# Patient Record
Sex: Male | Born: 1995 | Race: White | Hispanic: No | Marital: Single | Smoking: Former smoker
Health system: Southern US, Community
[De-identification: ages and names within clinical notes are randomized; demographics above are authoritative.]

---

## 2019-08-10 ENCOUNTER — Ambulatory Visit
Admission: EM | Admit: 2019-08-10 | Discharge: 2019-08-10 | Disposition: A | Discharge status: Home / Self Care | Payer: Self-pay

## 2019-08-10 ENCOUNTER — Other Ambulatory Visit: Payer: Self-pay

## 2019-08-10 DIAGNOSIS — R112 Nausea with vomiting, unspecified: Secondary | ICD-10-CM

## 2019-08-10 DIAGNOSIS — R197 Diarrhea, unspecified: Secondary | ICD-10-CM

## 2019-08-10 NOTE — ED Provider Notes (Signed)
[facility]    CSN: [id number] Arrival date & time: 08/10/19  1003      History   Chief Complaint Chief Complaint  Patient presents with  . Letter for School/Work    HPI Kenniel Trebilcock is a 23 y.o. male.   HPI   23 year old male presents after having the onset of nausea vomiting and diarrhea 3 days prior to this visit.  States it lasted approximately 24 hours and then abated.  At the present time he has no symptoms.  Employer wanted to have him seen and return to work with a clearance note.  He states that he did not eat outside of the home.  This likely was a virus.        History reviewed. No pertinent past medical history.  There are no active problems to display for this patient.   History reviewed. No pertinent surgical history.     Home Medications    Prior to Admission medications   Not on File    Family History History reviewed. No pertinent family history.  Social History Social History   Tobacco Use  . Smoking status: Current Every Day Smoker    Packs/day: 1.00    Types: Cigarettes  Substance Use Topics  . Alcohol use: Yes    Comment: occassionally  . Drug use: Not on file     Allergies   Patient has no known allergies.   Review of Systems Review of Systems  Constitutional: Negative for activity change, appetite change, chills, diaphoresis, fatigue and fever.  Gastrointestinal: Positive for diarrhea, nausea and vomiting. Negative for abdominal pain and constipation.  All other systems reviewed and are negative.    Physical Exam Triage Vital Signs ED Triage Vitals [08/10/19 1021]  Enc Vitals Group     BP (!) 147/80     Pulse Rate 94     Resp 18     Temp 98.3 F (36.8 C)     Temp Source Oral     SpO2 100 %     Weight 180 lb (81.6 kg)     Height 6' (1.829 m)     Head Circumference      Peak Flow      Pain Score 0     Pain Loc      Pain Edu?      Excl. in [place]?    No data found.  Updated Vital Signs BP (!)  147/80 (BP Location: Left Arm)   Pulse 94   Temp 98.3 F (36.8 C) (Oral)   Resp 18   Ht 6' (1.829 m)   Wt 180 lb (81.6 kg)   SpO2 100%   BMI 24.41 kg/m   Visual Acuity Right Eye Distance:   Left Eye Distance:   Bilateral Distance:    Right Eye Near:   Left Eye Near:    Bilateral Near:     Physical Exam Vitals signs and nursing note reviewed.  Constitutional:      General: He is not in acute distress.    Appearance: Normal appearance. He is normal weight. He is not ill-appearing, toxic-appearing or diaphoretic.  HENT:     Head: Normocephalic and atraumatic.  Eyes:     Conjunctiva/sclera: Conjunctivae normal.  Neck:     Musculoskeletal: Normal range of motion and neck supple.  Abdominal:     General: Abdomen is flat. Bowel sounds are normal. There is no distension.     Palpations: Abdomen is soft. There is no mass.  Tenderness: There is no abdominal tenderness. There is no right CVA tenderness, left CVA tenderness, guarding or rebound.  Musculoskeletal: Normal range of motion.  Skin:    General: Skin is warm and dry.  Neurological:     General: No focal deficit present.     Mental Status: He is alert and oriented to person, place, and time.  Psychiatric:        Mood and Affect: Mood normal.        Behavior: Behavior normal.        Thought Content: Thought content normal.        Judgment: Judgment normal.      UC Treatments / Results  Labs (all labs ordered are listed, but only abnormal results are displayed) Labs Reviewed - No data to display  EKG   Radiology No results found.  Procedures Procedures (including critical care time)  Medications Ordered in UC Medications - No data to display  Initial Impression / Assessment and Plan / UC Course  I have reviewed the triage vital signs and the nursing notes.  Pertinent labs & imaging results that were available during my care of the patient were reviewed by me and considered in my medical decision  making (see chart for details).   This 23 year old male complained of having an episode of nausea vomiting and diarrhea that persisted for approximately 24 hours.  He states that it abated quickly and is not had the return of any symptoms.  He is in his usual normal state of health.  His employer requires a note for return to work.  Was provided to the patient.  Will return for second shift today.   Final Clinical Impressions(s) / UC Diagnoses   Final diagnoses:  Nausea vomiting and diarrhea     Discharge Instructions     Patient has improved from a bout of nausea vomiting and diarrhea days prior to this visit.  Now asymptomatic.  He may now return to work.    ED Prescriptions    None     PDMP not reviewed this encounter.   Lutricia Feil, PA-C 08/10/19 1139

## 2019-08-10 NOTE — Discharge Instructions (Signed)
Patient has improved from a bout of nausea vomiting and diarrhea days prior to this visit.  Now asymptomatic.  He may now return to work.

## 2019-08-10 NOTE — ED Triage Notes (Signed)
Pt reports that his boss asked him to come here "and get checked out and get a note for work". Pt reports NVD on Friday only that went away by that evening. Pt denies NVD, abdominal pain or any other sx currently.

## 2019-12-11 ENCOUNTER — Ambulatory Visit
Admission: EM | Admit: 2019-12-11 | Discharge: 2019-12-11 | Disposition: A | Discharge status: Home / Self Care | Payer: Self-pay | Attending: Family Medicine | Admitting: Family Medicine

## 2019-12-11 ENCOUNTER — Ambulatory Visit (INDEPENDENT_AMBULATORY_CARE_PROVIDER_SITE_OTHER): Payer: Self-pay

## 2019-12-11 ENCOUNTER — Encounter: Payer: Self-pay | Admitting: Emergency Medicine

## 2019-12-11 ENCOUNTER — Other Ambulatory Visit: Payer: Self-pay

## 2019-12-11 DIAGNOSIS — M545 Low back pain: Secondary | ICD-10-CM

## 2019-12-11 DIAGNOSIS — M542 Cervicalgia: Secondary | ICD-10-CM

## 2019-12-11 DIAGNOSIS — W19XXXA Unspecified fall, initial encounter: Secondary | ICD-10-CM

## 2019-12-11 DIAGNOSIS — S161XXA Strain of muscle, fascia and tendon at neck level, initial encounter: Secondary | ICD-10-CM

## 2019-12-11 DIAGNOSIS — M546 Pain in thoracic spine: Secondary | ICD-10-CM

## 2019-12-11 DIAGNOSIS — S29019A Strain of muscle and tendon of unspecified wall of thorax, initial encounter: Secondary | ICD-10-CM

## 2019-12-11 DIAGNOSIS — S39012A Strain of muscle, fascia and tendon of lower back, initial encounter: Secondary | ICD-10-CM

## 2019-12-11 DIAGNOSIS — S20229A Contusion of unspecified back wall of thorax, initial encounter: Secondary | ICD-10-CM

## 2019-12-11 MED ORDER — CYCLOBENZAPRINE HCL 10 MG PO TABS: 10.0000 mg | ORAL_TABLET | Freq: Every day | ORAL | 0 refills | Status: DC

## 2019-12-11 NOTE — ED Provider Notes (Signed)
[facility]    CSN: [id number] Arrival date & time: 12/11/19  1625      History   Chief Complaint Chief Complaint  Patient presents with  . Back Pain    HPI Isaac Velazquez is a 24 y.o. male.   24 yo male with a c/o neck and back pain after falling yesterday. States he slipped and fell straight on his back yesterday. Denies hitting his head or loss of consciousness. Denies any numbness/tingling, unilateral weakness.      History reviewed. No pertinent past medical history.  There are no problems to display for this patient.   History reviewed. No pertinent surgical history.     Home Medications    Prior to Admission medications   Medication Sig Start Date End Date Taking? Authorizing Provider  cyclobenzaprine (FLEXERIL) 10 MG tablet Take 1 tablet (10 mg total) by mouth at bedtime. 12/11/19   Norval Gable, MD    Family History Family History  Problem Relation Age of Onset  . Healthy Mother   . Healthy Father     Social History Social History   Tobacco Use  . Smoking status: Current Every Day Smoker    Packs/day: 1.00    Types: E-cigarettes  . Smokeless tobacco: Never Used  Substance Use Topics  . Alcohol use: Yes    Comment: occassionally  . Drug use: Not on file     Allergies   Patient has no known allergies.   Review of Systems Review of Systems   Physical Exam Triage Vital Signs ED Triage Vitals  Enc Vitals Group     BP 12/11/19 1641 119/67     Pulse Rate 12/11/19 1641 66     Resp 12/11/19 1641 16     Temp 12/11/19 1641 98 F (36.7 C)     Temp Source 12/11/19 1641 Oral     SpO2 12/11/19 1641 100 %     Weight 12/11/19 1638 180 lb (81.6 kg)     Height 12/11/19 1638 6\' 1"  (1.854 m)     Head Circumference --      Peak Flow --      Pain Score 12/11/19 1638 8     Pain Loc --      Pain Edu? --      Excl. in [place]? --    No data found.  Updated Vital Signs BP 119/67 (BP Location: Right Arm)   Pulse 66   Temp 98 F (36.7  C) (Oral)   Resp 16   Ht 6\' 1"  (1.854 m)   Wt 81.6 kg   SpO2 100%   BMI 23.75 kg/m   Visual Acuity Right Eye Distance:   Left Eye Distance:   Bilateral Distance:    Right Eye Near:   Left Eye Near:    Bilateral Near:     Physical Exam Vitals and nursing note reviewed.  Constitutional:      General: He is not in acute distress.    Appearance: He is not toxic-appearing or diaphoretic.  Eyes:     Extraocular Movements: Extraocular movements intact.     Pupils: Pupils are equal, round, and reactive to light.  Cardiovascular:     Rate and Rhythm: Normal rate.  Pulmonary:     Effort: Pulmonary effort is normal. No respiratory distress.  Musculoskeletal:     Cervical back: Spasms, tenderness and bony tenderness present. No swelling, edema, deformity, erythema, lacerations, rigidity, torticollis or crepitus. Normal range of motion.  Thoracic back: Spasms, tenderness and bony tenderness present. No swelling, edema, deformity, signs of trauma or lacerations. Normal range of motion. No scoliosis.     Lumbar back: Spasms, tenderness and bony tenderness present. No swelling, edema, deformity, signs of trauma or lacerations. Normal range of motion. No scoliosis.  Neurological:     Mental Status: He is alert.      UC Treatments / Results  Labs (all labs ordered are listed, but only abnormal results are displayed) Labs Reviewed - No data to display  EKG   Radiology DG Cervical Spine Complete  Result Date: 12/11/2019 CLINICAL DATA:  Pain after fall EXAM: CERVICAL SPINE - COMPLETE 4+ VIEW COMPARISON:  None. FINDINGS: Straightening of the cervical spine. Normal vertebral body heights. Disc spaces are relatively preserved. Dens and lateral masses are within normal limits. IMPRESSION: Straightening of the cervical spine.  No acute osseous abnormality. Electronically Signed   By: Jasmine Pang M.D.   On: 12/11/2019 18:02   DG Thoracic Spine 2 View  Result Date: 12/11/2019 CLINICAL  DATA:  Pain after fall EXAM: THORACIC SPINE 2 VIEWS COMPARISON:  None. FINDINGS: There is no evidence of thoracic spine fracture. Alignment is normal. No other significant bone abnormalities are identified. IMPRESSION: Negative. Electronically Signed   By: Jasmine Pang M.D.   On: 12/11/2019 18:03   DG Lumbar Spine Complete  Result Date: 12/11/2019 CLINICAL DATA:  Pain after fall EXAM: LUMBAR SPINE - COMPLETE 4+ VIEW COMPARISON:  None. FINDINGS: There is no evidence of lumbar spine fracture. Alignment is normal. Intervertebral disc spaces are maintained. IMPRESSION: Negative. Electronically Signed   By: Jasmine Pang M.D.   On: 12/11/2019 18:01    Procedures Procedures (including critical care time)  Medications Ordered in UC Medications - No data to display  Initial Impression / Assessment and Plan / UC Course  I have reviewed the triage vital signs and the nursing notes.  Pertinent labs & imaging results that were available during my care of the patient were reviewed by me and considered in my medical decision making (see chart for details).      Final Clinical Impressions(s) / UC Diagnoses   Final diagnoses:  Strain of lumbar region, initial encounter  Thoracic myofascial strain, initial encounter  Strain of neck muscle, initial encounter  Contusion of back, unspecified laterality, initial encounter  Fall, initial encounter     Discharge Instructions     Rest, ice/heat, over the counter tylenol/advil as needed    ED Prescriptions    Medication Sig Dispense Auth. Provider   cyclobenzaprine (FLEXERIL) 10 MG tablet Take 1 tablet (10 mg total) by mouth at bedtime. 30 tablet Payton Mccallum, MD      1. x-ray results and diagnosis reviewed with patient 2. rx as per orders above; reviewed possible side effects, interactions, risks and benefits  3. Recommend supportive treatment as above 4. Follow-up prn if symptoms worsen or don't improve   I have reviewed the PDMP during  this encounter.   Payton Mccallum, MD 12/13/19 1053

## 2019-12-11 NOTE — Discharge Instructions (Addendum)
Rest, ice/heat, over the counter tylenol/advil as needed

## 2019-12-11 NOTE — ED Triage Notes (Addendum)
Patient states that he slipped and fell at work last night at work and c/o lower, and and upper back pain.  Patient states that he fell on dirt, mud and a piece of wood. Patient states that he does not want to file worker's comp.  Patient denies hitting his head and no LOC.

## 2020-11-08 IMAGING — CR DG LUMBAR SPINE COMPLETE 4+V
5 series · 5 of 5 positions shown · non-contrast
Comparison: None.

CLINICAL DATA: Pain after fall

EXAM:
LUMBAR SPINE - COMPLETE 4+ VIEW

[l-spine ap]
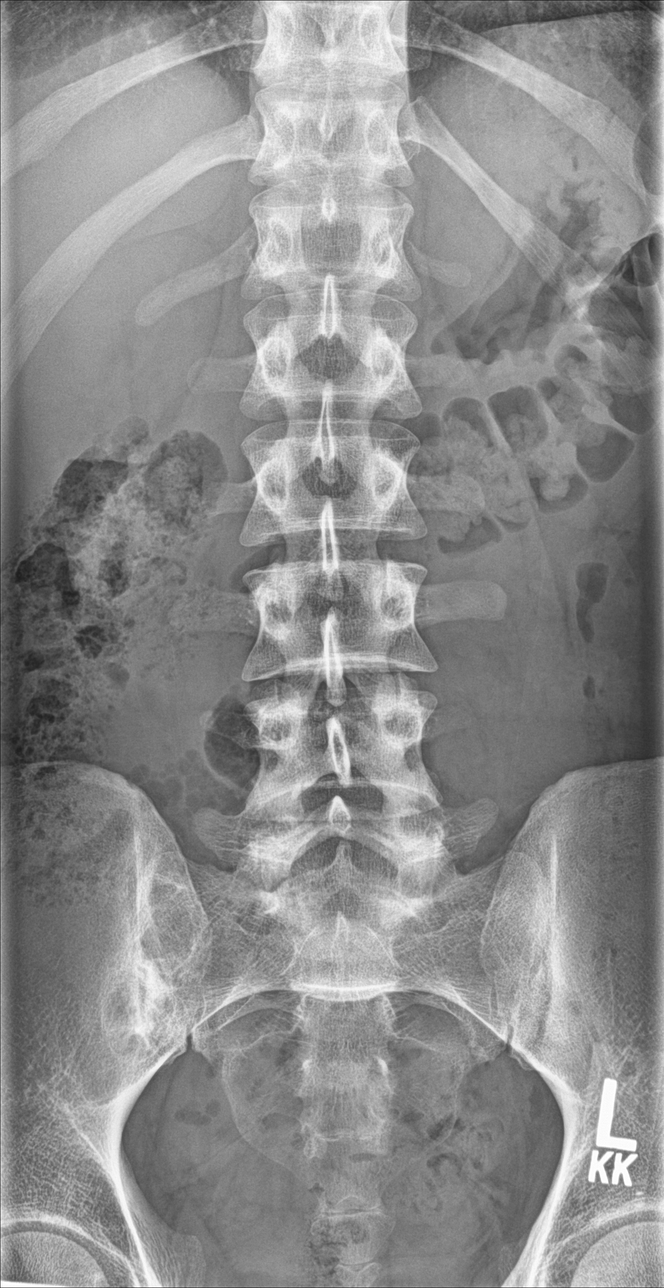

[l-spine obl (1 of 2)]
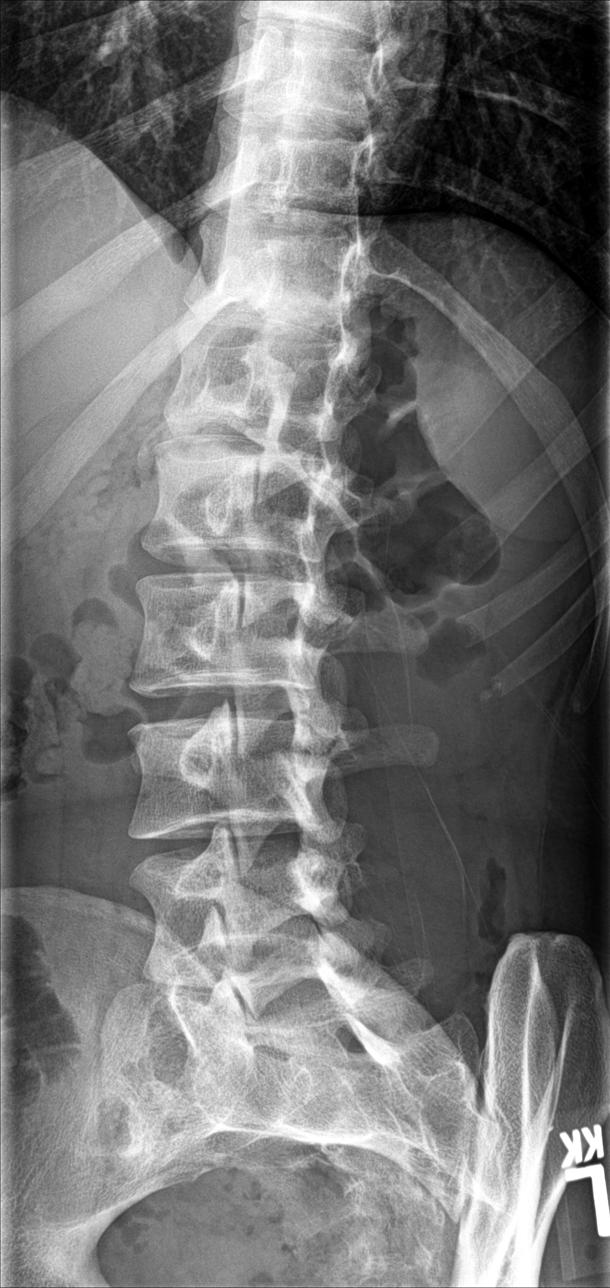

[l-spine obl (2 of 2)]
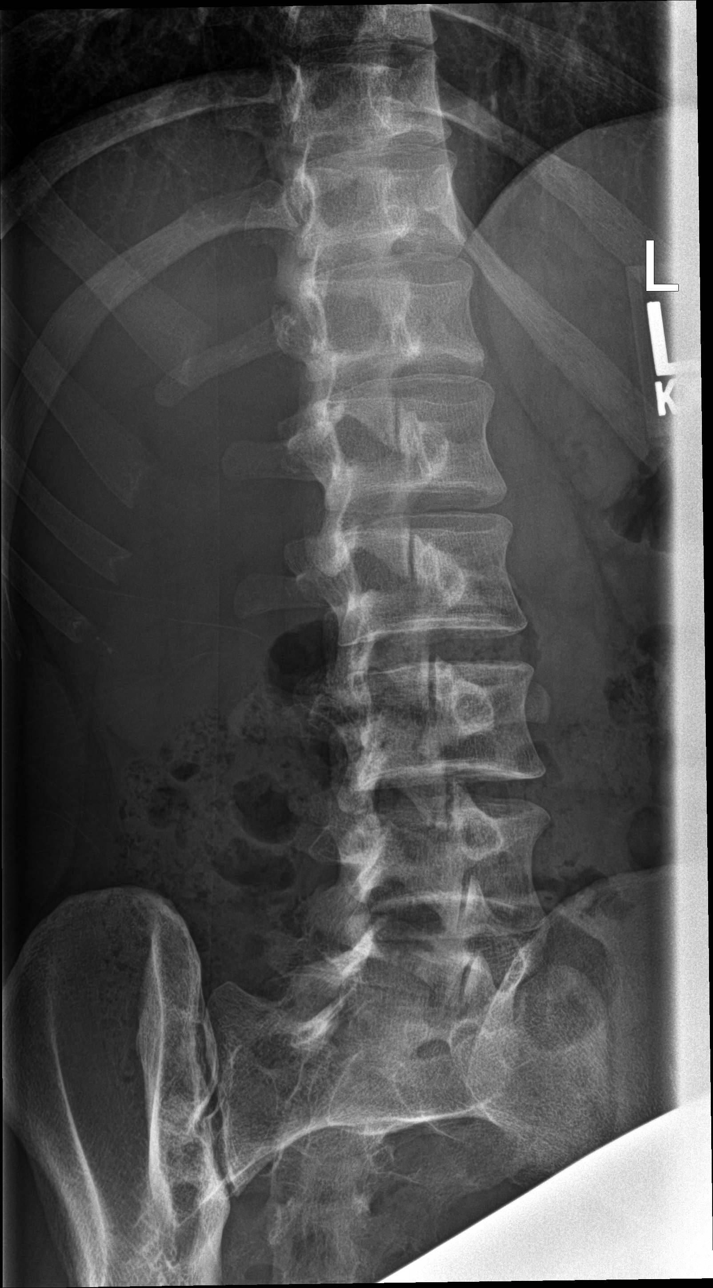

[l-spine lat]
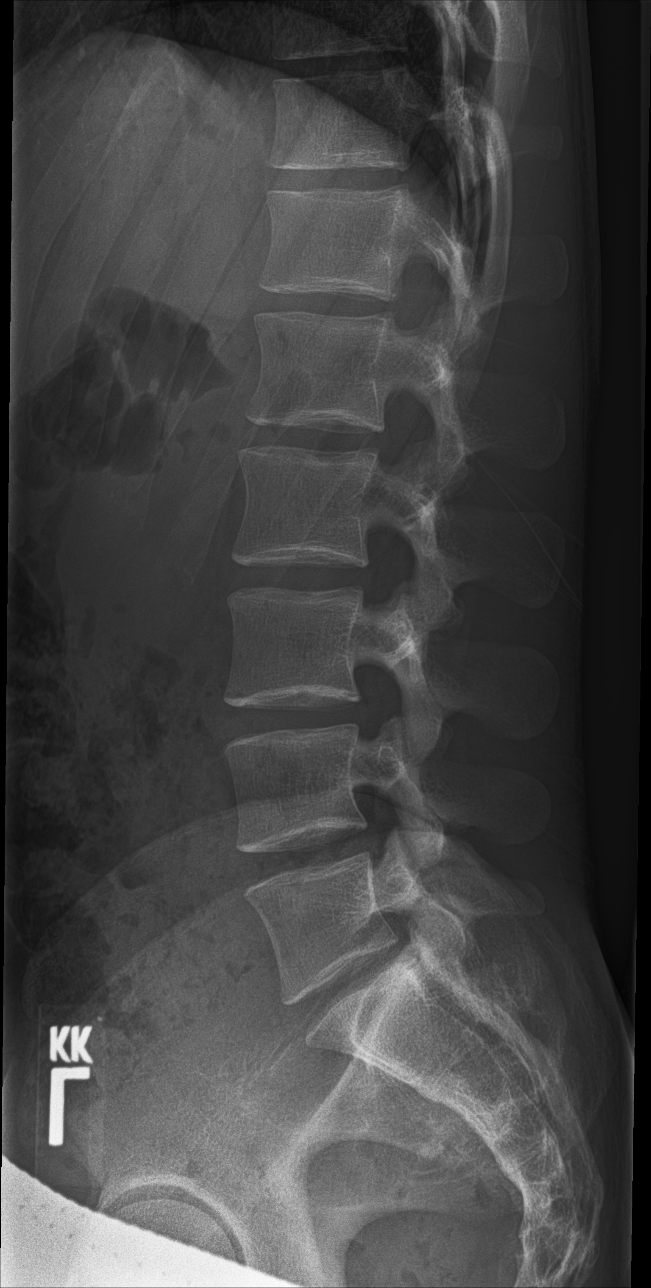

[l-spine spot]
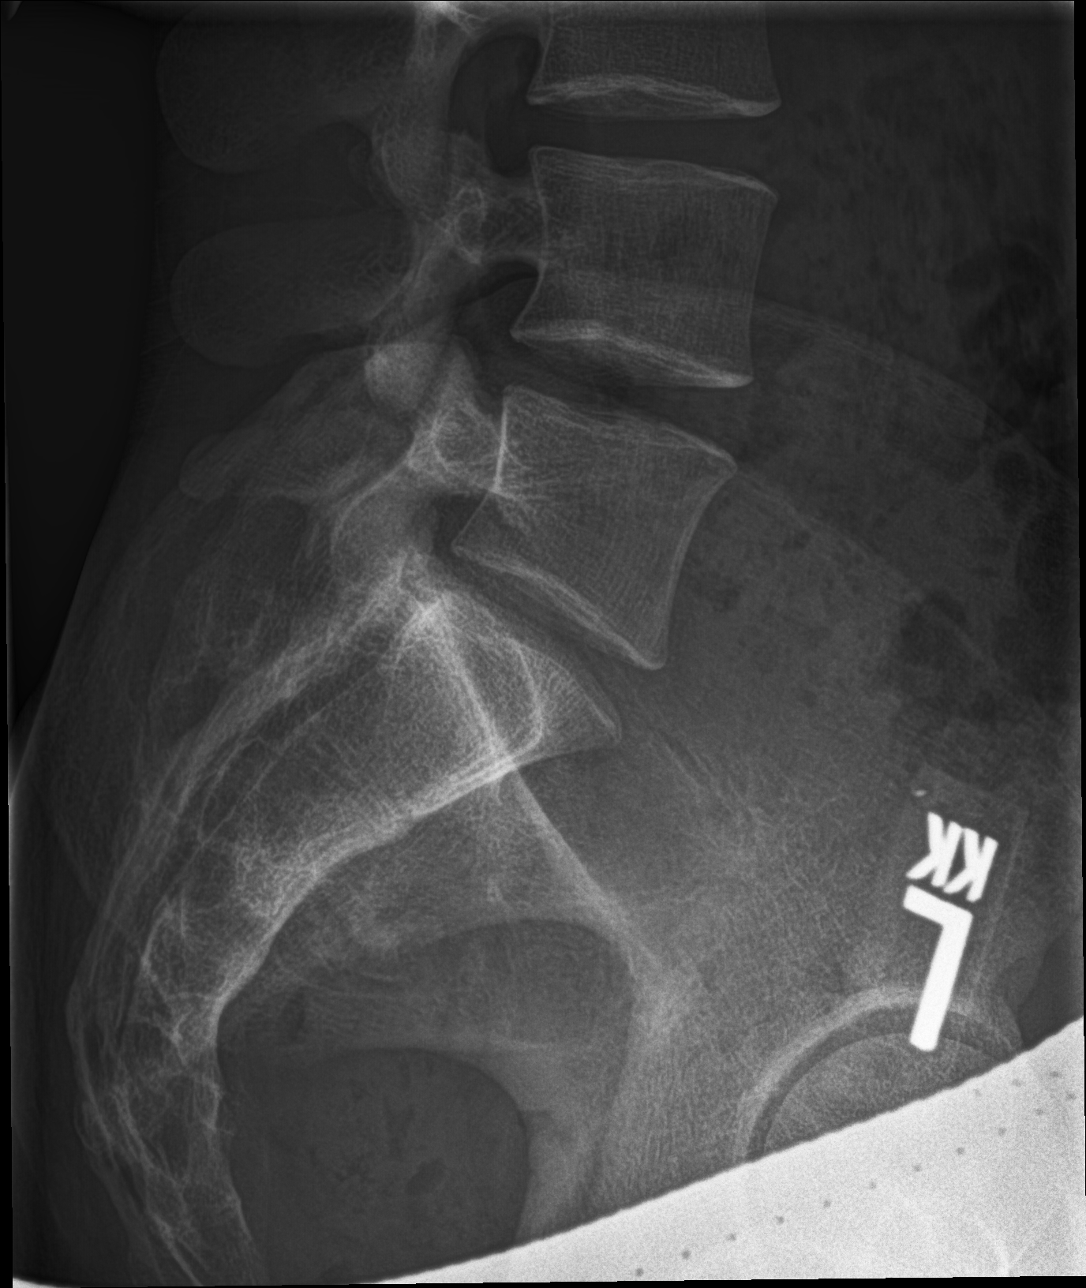

[5 of 5 positions shown; findings below may reference images not displayed]

FINDINGS: There is no evidence of lumbar spine fracture. Alignment is normal.
Intervertebral disc spaces are maintained.
IMPRESSION: Negative.

## 2022-04-13 ENCOUNTER — Ambulatory Visit
Admission: EM | Admit: 2022-04-13 | Discharge: 2022-04-13 | Disposition: A | Discharge status: Home / Self Care | Payer: BLUE CROSS/BLUE SHIELD | Attending: Emergency Medicine | Admitting: Emergency Medicine

## 2022-04-13 DIAGNOSIS — M67432 Ganglion, left wrist: Secondary | ICD-10-CM

## 2022-04-13 DIAGNOSIS — M25532 Pain in left wrist: Secondary | ICD-10-CM | POA: Diagnosis not present

## 2022-04-13 MED ORDER — PREDNISONE 20 MG PO TABS: 40.0000 mg | ORAL_TABLET | Freq: Every day | ORAL | 0 refills | Status: AC

## 2022-04-13 MED ORDER — NAPROXEN 500 MG PO TABS: 500.0000 mg | ORAL_TABLET | Freq: Two times a day (BID) | ORAL | 0 refills | Status: DC

## 2022-04-13 NOTE — ED Triage Notes (Signed)
Pt presents with left wrist pain X 1 week with no known injury.

## 2022-04-13 NOTE — ED Provider Notes (Signed)
HPI  SUBJECTIVE:  Isaac Velazquez is a left-handed 26 y.o. male who presents with left dorsal/volar mid wrist pain for the past week.  It is constant, daily, he describes it as sharp, stabbing, pressure.  He states that is primarily on the dorsum of the wrist.  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  He denies numbness or tingling in his fingers, trauma to the wrist, wrist erythema, edema.  He reports grip weakness secondary to the pain, primarily with pushing against things.  He has never had symptoms like this before.  He tried rest, alternating ice and heat.  Symptoms are better when he places his hand in his right axilla and pulling, placing traction on his left wrist.  Symptoms are worse with flexion and extension.  He has no past medical history.  PCP: None    History reviewed. No pertinent past medical history.  History reviewed. No pertinent surgical history.  Family History  Problem Relation Age of Onset   Healthy Mother    Healthy Father     Social History   Tobacco Use   Smoking status: Every Day    Packs/day: 1.00    Types: E-cigarettes, Cigarettes   Smokeless tobacco: Never  Vaping Use   Vaping Use: Every day  Substance Use Topics   Alcohol use: Yes    Comment: occassionally    No current facility-administered medications for this encounter.  Current Outpatient Medications:    naproxen (NAPROSYN) 500 MG tablet, Take 1 tablet (500 mg total) by mouth 2 (two) times daily., Disp: 20 tablet, Rfl: 0   predniSONE (DELTASONE) 20 MG tablet, Take 2 tablets (40 mg total) by mouth daily with breakfast for 5 days., Disp: 10 tablet, Rfl: 0  No Known Allergies   ROS  As noted in HPI.   Physical Exam  BP 126/75 (BP Location: Left Arm)   Pulse 68   Temp 98.6 F (37 C) (Oral)   Resp 18   SpO2 98%    Constitutional: Well developed, well nourished, no acute distress Eyes:  EOMI, conjunctiva normal bilaterally HENT: Normocephalic, atraumatic,mucus membranes moist Respiratory: Normal inspiratory effort Cardiovascular: Normal rate GI: nondistended skin: No rash, skin intact Musculoskeletal: Left wrist: Dorsum mid left wrist tender.  Mild swelling/tender mass dorsum mid wrist noted with flexion.  distal radius NT , distal ulnar styloid NT, snuffbox NT, carpals NT , metacarpals NT, digits NT , TFCC NT.  Pain with flexion, extension.  No pain with supination,  no pain with pronation, no pain with radial / ulnar deviation. Motor intact ability to flex / extend digits, Sensation LT to hand normal. Rp 2+.  No bruising, erythema, edema.   Neurologic: Alert & oriented x 3, no focal neuro deficits Psychiatric: Speech and behavior appropriate   ED Course   Medications - No data to display  Orders Placed This Encounter  Procedures   AMB referral to sports medicine    Referral Priority:   Routine    Referral Type:   Consultation    Referral Reason:   Specialty Services Required    Referred to Provider:   Montel Culver, MD    Number of Visits Requested:   1   Apply Wrist brace    Standing Status:   Standing    Number of Occurrences:  1    Order Specific Question:   Laterality    Answer:   Left    No results found for this or any previous visit (from the past 24 hour(s)). No results found.  ED Clinical Impression  1. Ganglion of left wrist   2. Left wrist pain      ED Assessment/Plan  Suspect ganglion cyst.  It is now interfering with work and daily activities.  Did not do imaging in the absence of trauma.  Doubts significant arthritis at age 73.  Will place in immobilizer, send home with Naprosyn/Tylenol, 40 mg prednisone for 5 days.  Will refer to Dr. Rosette Reveal, sports medicine or to Dr. Jackqulyn Livings, hand at [facility] if not better with this.  Discussed  MDM,  treatment plan, and plan for follow-up with patient.patient agrees with plan.   Meds ordered this encounter  Medications   naproxen (NAPROSYN) 500 MG tablet    Sig: Take 1 tablet (500 mg total) by mouth 2 (two) times daily.    Dispense:  20 tablet    Refill:  0   predniSONE (DELTASONE) 20 MG tablet    Sig: Take 2 tablets (40 mg total) by mouth daily with breakfast for 5 days.    Dispense:  10 tablet    Refill:  0      *This clinic note was created using [employer]. Therefore, there may be occasional mistakes despite careful proofreading.  ?'    Melynda Ripple, MD 04/13/22 [phone]

## 2022-04-13 NOTE — Discharge Instructions (Addendum)
I suspect you have a ganglion cyst.  Wear the splint as needed for comfort.  Especially with activities.  To take your wrist out and moving around to keep it mobile.  Take the Naprosyn with 2 extra strength Tylenols twice a day, prednisone may help.  Please follow-up with Dr. Jaysun Wessels Royalty, I have put in referral to him, or you can follow-up with [facility].  Here is a list of primary care providers who are taking new patients:  [facility] [place] Dr. Joseph Berkshire (sports medicine) Dr. Elizabeth Sauer [address] [place] [address] [phone]  [facility] [address] [place], [place] [address] [phone]  [facility] [place] [address]  [place] [address]  [phone]  [facility] [address] [place], [place] [address] [phone]  [facility] [address] [place]  [phone] [place], [place] [address]  Here are clinics/ other resources who will see you if you do not have insurance. Some have certain criteria that you must meet. Call them and find out what they are:  [facility]: [address], [place], [place] [address] Phone: [phone] Hours: First and Third Saturdays of each Month, 9 a.m. - 1 p.m.  [facility]: [address] [place], [place] [address] Phone: [phone] Hours: Tuesday, 4 p.m. - 8 p.m. Thursday, 1 p.m. - 8 p.m. Wednesday, 9 a.m. - [facility] [address] [place], [place] [address] Phone: [phone] Pharmacy Phone Number: [phone] Dental Phone Number: [phone] [facility] Insurance Help: [phone]  Dental Hours: Monday - Thursday, 8 a.m. - 6 p.m.  [facility] [address], [place], [place] [address] Phone: [phone] Pharmacy Phone Number: [phone] [facility] Insurance Help: [phone]  [facility] [address] [place] [place], [place] [address] Phone:  [phone] Pharmacy Phone Number: [phone] [facility] Insurance Help: [phone]  [facility] [address] [place], [place] [address] Phone: [phone] [facility] Insurance Help: [phone]   [facility] [address], [place], [place] [address] Phone: [phone]  Go to www.goodrx.com  or www.costplusdrugs.com to look up your medications. This will give you a list of where you can find your prescriptions at the most affordable prices. Or ask the pharmacist what the cash price is, or if they have any other discount programs available to help make your medication more affordable. This can be less expensive than what you would pay with insurance.

## 2022-04-18 ENCOUNTER — Ambulatory Visit
Admission: EM | Admit: 2022-04-18 | Discharge: 2022-04-18 | Disposition: A | Discharge status: Home / Self Care | Payer: BLUE CROSS/BLUE SHIELD

## 2022-04-18 DIAGNOSIS — M67432 Ganglion, left wrist: Secondary | ICD-10-CM | POA: Diagnosis not present

## 2022-04-18 NOTE — ED Triage Notes (Signed)
Pt here to have a release back to work from being in a writ brace for pain.

## 2022-04-18 NOTE — Discharge Instructions (Signed)
You are cleared to return to work.

## 2022-04-18 NOTE — ED Provider Notes (Signed)
[facility]    CSN: [id number] Arrival date & time: 04/18/22  0818      History   Chief Complaint Chief Complaint  Patient presents with   Wrist Pain    HPI Isaac Velazquez is a 26 y.o. male.   HPI  26 year old male here for clearance to return to work.  Patient was diagnosed with a ganglion cyst of his left wrist on 04/13/22 in this urgent care and was referred to Dr. Zigmund Daniel.  He was given a wrist brace to help provide support.  He states that the day of his appointment to have his cyst evaluated he woke up and noticed that the cyst was gone and he had full range of motion of his wrist.  He denies any numbness, tingling, or weakness.  He states that he was only having difficulty with extension of his wrist.  That has now completely resolved.  History reviewed. No pertinent past medical history.  There are no problems to display for this patient.   History reviewed. No pertinent surgical history.     Home Medications    Prior to Admission medications   Medication Sig Start Date End Date Taking? Authorizing Provider  naproxen (NAPROSYN) 500 MG tablet Take 1 tablet (500 mg total) by mouth 2 (two) times daily. 04/13/22   Melynda Ripple, MD  predniSONE (DELTASONE) 20 MG tablet Take 2 tablets (40 mg total) by mouth daily with breakfast for 5 days. 04/13/22 04/18/22  Melynda Ripple, MD    Family History Family History  Problem Relation Age of Onset   Healthy Mother    Healthy Father     Social History Social History   Tobacco Use   Smoking status: Every Day    Packs/day: 1.00    Types: E-cigarettes, Cigarettes   Smokeless tobacco: Never  Vaping Use   Vaping Use: Every day  Substance Use Topics   Alcohol use: Yes    Comment: occassionally     Allergies   Patient has no known allergies.   Review of Systems Review of Systems  Musculoskeletal:  Positive for arthralgias and joint swelling.    Physical Exam Triage Vital Signs ED Triage Vitals  [04/18/22 0824]  Enc Vitals Group     BP 124/82     Pulse Rate 74     Resp 18     Temp 98.6 F (37 C)     Temp Source Oral     SpO2 98 %     Weight      Height      Head Circumference      Peak Flow      Pain Score      Pain Loc      Pain Edu?      Excl. in [place]?    No data found.  Updated Vital Signs BP 124/82 (BP Location: Left Arm)   Pulse 74   Temp 98.6 F (37 C) (Oral)   Resp 18   SpO2 98%   Visual Acuity Right Eye Distance:   Left Eye Distance:   Bilateral Distance:    Right Eye Near:   Left Eye Near:    Bilateral Near:     Physical Exam Vitals and nursing note reviewed.  Constitutional:      Appearance: Normal appearance. He is not ill-appearing.  Musculoskeletal:        General: No swelling, tenderness, deformity or signs of injury. Normal range of motion.  Skin:    General: Skin  is warm and dry.     Capillary Refill: Capillary refill takes less than 2 seconds.     Findings: No erythema or rash.  Neurological:     General: No focal deficit present.     Mental Status: He is alert and oriented to person, place, and time.     Sensory: No sensory deficit.     Motor: No weakness.  Psychiatric:        Mood and Affect: Mood normal.        Behavior: Behavior normal.        Thought Content: Thought content normal.        Judgment: Judgment normal.     UC Treatments / Results  Labs (all labs ordered are listed, but only abnormal results are displayed) Labs Reviewed - No data to display  EKG   Radiology No results found.  Procedures Procedures (including critical care time)  Medications Ordered in UC Medications - No data to display  Initial Impression / Assessment and Plan / UC Course  I have reviewed the triage vital signs and the nursing notes.  Pertinent labs & imaging results that were available during my care of the patient were reviewed by me and considered in my medical decision making (see chart for details).  Patient is a  pleasant, nontoxic-appearing 26 year old male here requesting clearance to return to work.  He works as a Fish farm manager and his boss noticed that he was wearing a wrist brace on his left wrist.  At his boss's instruction he was told to be reevaluated and given clearance to return to work.  He needed to have full range of motion of his wrist.  Patient was wearing a wrist brace because he was diagnosed with a ganglion cyst on April 13, 2022 at this urgent care.  He was referred to Dr. Zigmund Daniel for definitive treatment and he reports that the day of his appointment he woke up and the cyst was gone and he had full range of motion of his wrist without any difficulty.  He denies any numbness or tingling in his fingers.  On exam patient's left wrist is in normal anatomical alignment and there is no evidence of a cyst on the volar or dorsal aspect of the wrist.  Patient has full sensation in his fingers and his cap refill is less than 2 seconds.  His grip is 5/5.  His range of motion is unencumbered with flexion, extension, ulnar deviation, radial deviation, and pronation and supination of the wrist demonstrated without difficulty.  I have cleared the patient to return to work and we will give him a note stating that he is cleared for full duty.   Final Clinical Impressions(s) / UC Diagnoses   Final diagnoses:  Ganglion cyst of wrist, left     Discharge Instructions      You are cleared to return to work.     ED Prescriptions   None    PDMP not reviewed this encounter.   Margarette Canada, NP 04/18/22 [phone]

## 2024-05-21 ENCOUNTER — Ambulatory Visit (HOSPITAL_COMMUNITY)
Admission: EM | Admit: 2024-05-21 | Discharge: 2024-05-21 | Disposition: A | Discharge status: Home / Self Care | Attending: Nurse Practitioner | Admitting: Nurse Practitioner

## 2024-05-21 DIAGNOSIS — Z5971 Insufficient health insurance coverage: Secondary | ICD-10-CM | POA: Insufficient documentation

## 2024-05-21 DIAGNOSIS — G4700 Insomnia, unspecified: Secondary | ICD-10-CM | POA: Insufficient documentation

## 2024-05-21 DIAGNOSIS — F411 Generalized anxiety disorder: Secondary | ICD-10-CM | POA: Insufficient documentation

## 2024-05-21 NOTE — Discharge Instructions (Signed)
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
## 2024-10-26 ENCOUNTER — Other Ambulatory Visit: Payer: Self-pay

## 2024-10-26 ENCOUNTER — Ambulatory Visit
Admission: RE | Admit: 2024-10-26 | Discharge: 2024-10-26 | Disposition: A | Discharge status: Home / Self Care | Payer: Self-pay | Source: Home / Self Care

## 2024-10-26 VITALS — BP 111/68 | HR 88 | Temp 98.1°F | Resp 18

## 2024-10-26 DIAGNOSIS — K644 Residual hemorrhoidal skin tags: Secondary | ICD-10-CM

## 2024-10-26 MED ORDER — HYDROCORTISONE ACETATE 25 MG RE SUPP: 25.0000 mg | Freq: Two times a day (BID) | RECTAL | 0 refills | Status: AC

## 2024-10-26 NOTE — Discharge Instructions (Signed)
 You can try using hydrocortisone  suppositories twice daily to help with relief of hemorrhoid. As discussed the best way to prevent hemorrhoids and worsening of hemorrhoids is to avoid things that can cause some. Avoid sitting on the toilet for long periods of time.  If you are dealing with constipation try taking a stool softener to help with this and eating foods high in fiber such as fruits and vegetables as well as drinking lots of water. I have attached information for a primary care doctor located in [place] that you can get established with for further evaluation and management of ongoing hemorrhoids if needed. You can also try contacting local primary care offices in [place] to get established with a primary care doctor there as well. Return here as needed.

## 2024-10-26 NOTE — ED Triage Notes (Signed)
 Pt states he has been having rectal pain that started Friday. Pt states he has not had to strain or been constipated but does admit to long times on the toilet from being on his phone. Pt denies any fever or bleeding. Pt states he has tried preporation H and it has not helped.

## 2024-10-26 NOTE — ED Provider Notes (Signed)
 [facility]    CSN: [id number] Arrival date & time: 10/26/24  1339      History   Chief Complaint Chief Complaint  Patient presents with   Hemorrhoids    Started Friday morning - Entered by patient    HPI Isaac Velazquez is a 28 y.o. male.   Patient presents with rectal pain that began on 12/and he reports this has progressively worsened over the last few days.  Patient states that he believes he may have a hemorrhoid.  Patient reports that he has been applying topical Preparation H without relief.  Patient denies any known history of hemorrhoids.  Patient states that he has not been recently constipated and does not feel like he has been straining recently however he states that he has been on the toilet more frequently lately due to eating a lot of soup over the last few days.  Patient states that he has been on the toilet scrolling on his phone for long periods of time as well which he believes could be contributing to this.  Patient denies any rectal bleeding, abdominal pain, constipation, diarrhea, or fever.  The history is provided by the patient and medical records.    Past Medical History:  Diagnosis Date   Anxiety    Bipolar 2 disorder (HCC)    Depression     There are no active problems to display for this patient.   History reviewed. No pertinent surgical history.     Home Medications    Prior to Admission medications  Medication Sig Start Date End Date Taking? Authorizing Provider  hydrocortisone  (ANUSOL -HC) 25 MG suppository Place 1 suppository (25 mg total) rectally 2 (two) times daily. 10/26/24  Yes Johnie Flaming A, NP  hydrOXYzine (ATARAX) 25 MG tablet Take 25 mg by mouth every 4 (four) hours as needed for anxiety.   Yes [provider]  lamoTRIgine (LAMICTAL) 150 MG tablet Take 150 mg by mouth daily.   Yes [provider]    Family History Family History  Problem Relation Age of Onset   Healthy Mother    Healthy  Father     Social History Social History[1]   Allergies   Patient has no known allergies.   Review of Systems Review of Systems  Per HPI  Physical Exam Triage Vital Signs ED Triage Vitals  Encounter Vitals Group     BP 10/26/24 1357 111/68     Girls Systolic BP Percentile --      Girls Diastolic BP Percentile --      Boys Systolic BP Percentile --      Boys Diastolic BP Percentile --      Pulse Rate 10/26/24 1357 88     Resp 10/26/24 1357 18     Temp 10/26/24 1357 98.1 F (36.7 C)     Temp Source 10/26/24 1357 Oral     SpO2 10/26/24 1357 94 %     Weight --      Height --      Head Circumference --      Peak Flow --      Pain Score 10/26/24 1352 7     Pain Loc --      Pain Education --      Exclude from Growth Chart --    No data found.  Updated Vital Signs BP 111/68 (BP Location: Right Arm)   Pulse 88   Temp 98.1 F (36.7 C) (Oral)   Resp 18   SpO2 94%  Visual Acuity Right Eye Distance:   Left Eye Distance:   Bilateral Distance:    Right Eye Near:   Left Eye Near:    Bilateral Near:     Physical Exam Vitals and nursing note reviewed. Exam conducted with a chaperone present Joya RN present for exam).  Constitutional:      General: He is awake. He is not in acute distress.    Appearance: Normal appearance. He is well-developed and well-groomed. He is not ill-appearing.  Abdominal:     General: Abdomen is flat. Bowel sounds are normal.     Palpations: Abdomen is soft.     Tenderness: There is no abdominal tenderness.  Genitourinary:    Rectum: Tenderness and external hemorrhoid present. No mass or anal fissure.      Comments: Mildly tender external hemorrhoid measuring approximately 1 cm in diameter noted to rectum.  Without any fissures or obvious signs of bleeding. Skin:    General: Skin is warm and dry.  Neurological:     Mental Status: He is alert.  Psychiatric:        Behavior: Behavior is cooperative.      UC Treatments / Results   Labs (all labs ordered are listed, but only abnormal results are displayed) Labs Reviewed - No data to display  EKG   Radiology No results found.  Procedures Procedures (including critical care time)  Medications Ordered in UC Medications - No data to display  Initial Impression / Assessment and Plan / UC Course  I have reviewed the triage vital signs and the nursing notes.  Pertinent labs & imaging results that were available during my care of the patient were reviewed by me and considered in my medical decision making (see chart for details).     Patient is overall well-appearing.  Vitals are stable.  Exam findings consistent with single external hemorrhoid with complication.  Prescribed hydrocortisone  suppositories to help with this.  Discussed measures to prevent worsening of this or future incidence of hemorrhoids.  Discussed follow-up and return precautions. Final Clinical Impressions(s) / UC Diagnoses   Final diagnoses:  External hemorrhoid     Discharge Instructions      You can try using hydrocortisone  suppositories twice daily to help with relief of hemorrhoid. As discussed the best way to prevent hemorrhoids and worsening of hemorrhoids is to avoid things that can cause some. Avoid sitting on the toilet for long periods of time.  If you are dealing with constipation try taking a stool softener to help with this and eating foods high in fiber such as fruits and vegetables as well as drinking lots of water. I have attached information for a primary care doctor located in [place] that you can get established with for further evaluation and management of ongoing hemorrhoids if needed. You can also try contacting local primary care offices in [place] to get established with a primary care doctor there as well. Return here as needed.   ED Prescriptions     Medication Sig Dispense Auth. Provider   hydrocortisone  (ANUSOL -HC) 25 MG suppository Place 1  suppository (25 mg total) rectally 2 (two) times daily. 12 suppository Johnie Flaming A, NP      PDMP not reviewed this encounter.    [1]  Social History Tobacco Use   Smoking status: Former    Current packs/day: 1.00    Types: E-cigarettes, Cigarettes   Smokeless tobacco: Never  Vaping Use   Vaping status: Former  Substance Use Topics  Alcohol use: Not Currently    Comment: 7 months sober   Drug use: Yes    Types: Marijuana     Johnie Rumaldo LABOR, NP 10/26/24 1425
